# Patient Record
Sex: Female | Born: 1990 | Race: Black or African American | Hispanic: No | Marital: Single | Smoking: Current every day smoker
Health system: Southern US, Community
[De-identification: ages and names within clinical notes are randomized; demographics above are authoritative.]

## PROBLEM LIST (undated history)

## (undated) HISTORY — PX: NO PAST SURGERIES: SHX2092

---

## 2016-05-03 ENCOUNTER — Ambulatory Visit
Admission: EM | Admit: 2016-05-03 | Discharge: 2016-05-03 | Disposition: A | Discharge status: Home / Self Care | Payer: Medicaid Other | Attending: Family Medicine | Admitting: Family Medicine

## 2016-05-03 ENCOUNTER — Encounter: Payer: Self-pay | Admitting: *Deleted

## 2016-05-03 ENCOUNTER — Ambulatory Visit: Payer: Medicaid Other

## 2016-05-03 DIAGNOSIS — Z79899 Other long term (current) drug therapy: Secondary | ICD-10-CM | POA: Insufficient documentation

## 2016-05-03 DIAGNOSIS — M79644 Pain in right finger(s): Secondary | ICD-10-CM | POA: Diagnosis not present

## 2016-05-03 DIAGNOSIS — F17200 Nicotine dependence, unspecified, uncomplicated: Secondary | ICD-10-CM | POA: Insufficient documentation

## 2016-05-03 DIAGNOSIS — M12541 Traumatic arthropathy, right hand: Secondary | ICD-10-CM | POA: Diagnosis not present

## 2016-05-03 DIAGNOSIS — Z888 Allergy status to other drugs, medicaments and biological substances status: Secondary | ICD-10-CM | POA: Diagnosis not present

## 2016-05-03 MED ORDER — NAPROXEN 500 MG PO TABS: 500.0000 mg | ORAL_TABLET | Freq: Two times a day (BID) | ORAL | 0 refills | Status: DC

## 2016-05-03 NOTE — ED Provider Notes (Signed)
CSN: [id number]     Arrival date & time 05/03/16  1614 History   First MD Initiated Contact with Patient 05/03/16 1756     Chief Complaint  Patient presents with  . Finger Injury   (Consider location/radiation/quality/duration/timing/severity/associated sxs/prior Treatment) HPI  26 year old female who presents with nondominant right thumb pain that for about a month. Don't remember any injury that may have occurred. Indicates the IP joint is the most tender and swollen area. She has no pain of the M CP joint of the wrist or of the distal radius. 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 No pertinent past medical history. History reviewed. No pertinent surgical history. History reviewed. No pertinent family history. Social History  Substance Use Topics  . Smoking status: Current Every Day Smoker  . Smokeless tobacco: Never Used  . Alcohol use Yes   OB History    No data available     Review of Systems  Constitutional: Positive for activity change. Negative for chills, fatigue and fever.  Musculoskeletal: Positive for arthralgias and joint swelling.  All other systems reviewed and are negative.   Allergies  Norco [hydrocodone-acetaminophen]  Home Medications   Prior to Admission medications   Medication Sig Start Date End Date Taking? Authorizing Provider  norethindrone-ethinyl estradiol-iron (ESTROSTEP FE,TILIA FE,TRI-LEGEST FE) 1-20/1-30/1-35 MG-MCG tablet Take 1 tablet by mouth daily.   Yes Historical Provider, MD  naproxen (NAPROSYN) 500 MG tablet Take 1 tablet (500 mg total) by mouth 2 (two) times daily with a meal. 05/03/16   Lutricia FeilWilliam P Salisa Broz, PA-C   Meds Ordered and Administered this Visit  Medications - No data to display  BP 123/80 (BP Location: Right Arm)   Pulse 70   Temp 97.7 F (36.5 C) (Oral)   Resp 16   Ht 5\' 4"  (1.626 m)   Wt 160 lb (72.6 kg)    LMP 04/19/2016   SpO2 99%   BMI 27.46 kg/m  No data found.   Physical Exam  Constitutional: She is oriented to person, place, and time. She appears well-developed and well-nourished. No distress.  HENT:  Head: Normocephalic and atraumatic.  Eyes: Pupils are equal, round, and reactive to light.  Neck: Normal range of motion.  Musculoskeletal: Normal range of motion. She exhibits edema and tenderness.  Examination of the right nondominant hand swelling of the right thumb particularly of the IP joint. Induration is decreased due to tenderness. Is tender over the PIP joint to AP and bilateral compression.No Laxity of the collateral ligaments. Pull-through is good and strong.  Neurological: She is alert and oriented to person, place, and time.  Skin: Skin is warm and dry. She is not diaphoretic.  Psychiatric: She has a normal mood and affect. Her behavior is normal. Judgment and thought content normal.  Nursing note and vitals reviewed.   Urgent Care Course     Procedures (including critical care time)  Labs Review Labs Reviewed - No data to display  Imaging Review Dg Finger Thumb Right  Result Date: 05/03/2016 CLINICAL DATA:  26 y/o  F; first interphalangeal joint pain. EXAM: RIGHT THUMB 2+V COMPARISON:  None. FINDINGS: There is no evidence of fracture or dislocation. There is no evidence of arthropathy or other focal bone abnormality. Soft tissues are unremarkable IMPRESSION: Negative. Electronically Signed   By: Mitzi HansenLance  Furusawa-Stratton M.D.   On: 05/03/2016 18:36  Visual Acuity Review  Right Eye Distance:   Left Eye Distance:   Bilateral Distance:    Right Eye Near:   Left Eye Near:    Bilateral Near:     Patient was given a radial gutter wrist splint  MDM   1. Thumb pain, right   Traumatic arthritis IP joint New Prescriptions   NAPROXEN (NAPROSYN) 500 MG TABLET    Take 1 tablet (500 mg total) by mouth 2 (two) times daily with a meal.  Plan: 1. Test/x-ray  results and diagnosis reviewed with patient 2. rx as per orders; risks, benefits, potential side effects reviewed with patient 3. Recommend supportive treatment with Immobilization of the joint for at least a week and then gradually resume activities. She may come out of the splint for quiet times. Recommended that for all activities she wear the splint and forward activities that cause any discomfort. And anti-inflammatories for pain and inflammation. She needs to take these with food. If she is not improving she should follow-up with an orthopedic surgeon or her primary care.  4. F/u prn if symptoms worsen or don't improve     Lutricia Feil, PA-C 05/03/16 [address] [place], [place] 05/03/16 1907

## 2016-05-03 NOTE — ED Triage Notes (Signed)
Patient started having symptom of right thumb pain 1 month ago. Mechanism of injury unknown. Second joint of the thumb is inflamed.

## 2017-10-03 IMAGING — CR DG FINGER THUMB 2+V*R*
3 series · 3 of 3 positions shown · non-contrast
Comparison: None.

CLINICAL DATA: 25 y/o  F; first interphalangeal joint pain.

EXAM:
RIGHT THUMB 2+V

[finger ap]
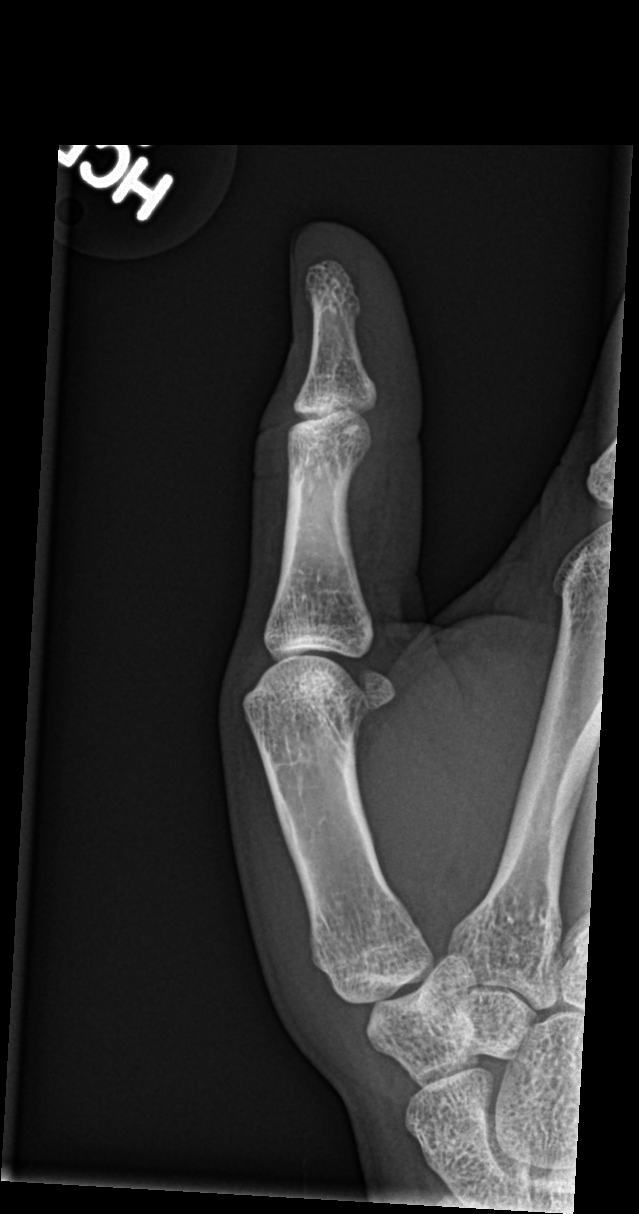

[finger obl]
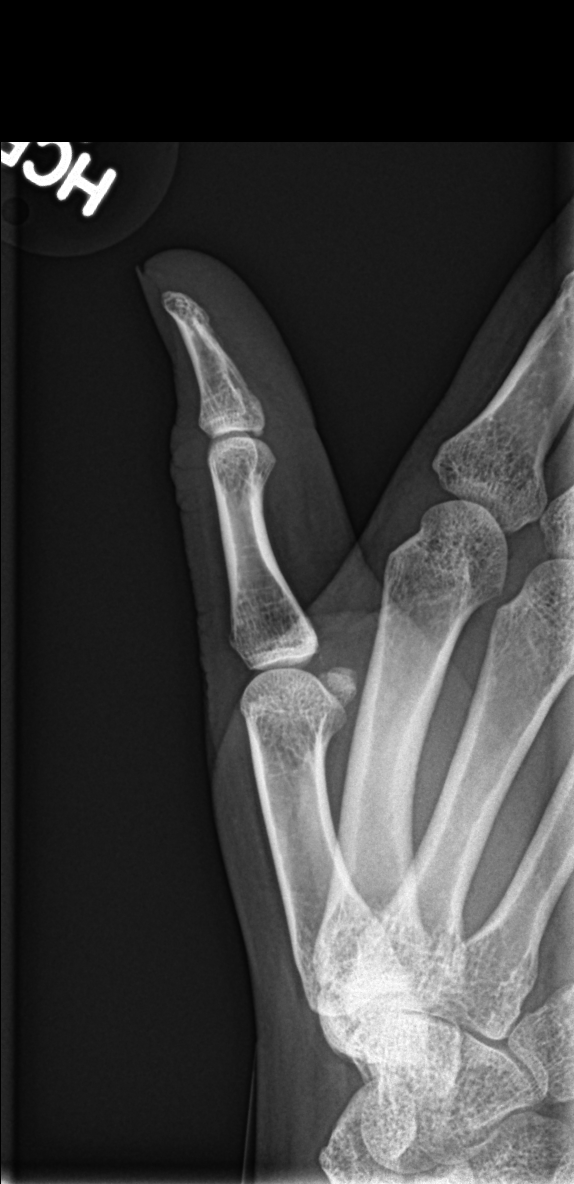

[finger lat]
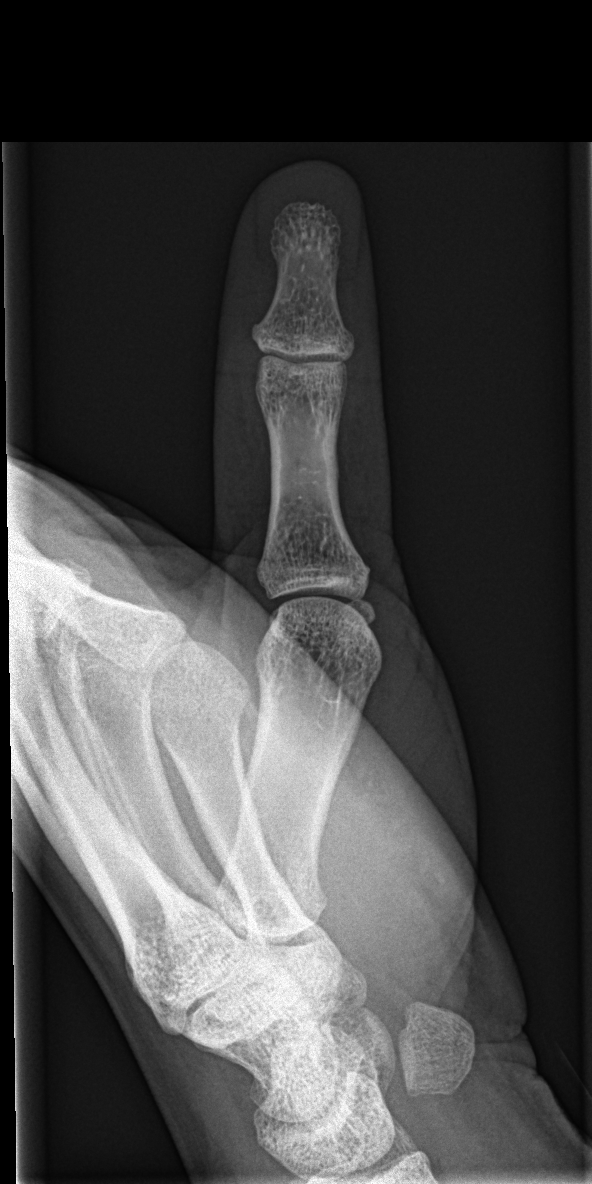

[3 of 3 positions shown; findings below may reference images not displayed]

FINDINGS: There is no evidence of fracture or dislocation. There is no
evidence of arthropathy or other focal bone abnormality. Soft
tissues are unremarkable
IMPRESSION: Negative.

By: Don Lolito Onacram M.D.

## 2017-10-24 ENCOUNTER — Ambulatory Visit
Admission: EM | Admit: 2017-10-24 | Discharge: 2017-10-24 | Disposition: A | Discharge status: Home / Self Care | Payer: Medicaid Other | Attending: Family Medicine | Admitting: Family Medicine

## 2017-10-24 ENCOUNTER — Encounter: Payer: Self-pay | Admitting: Emergency Medicine

## 2017-10-24 ENCOUNTER — Other Ambulatory Visit: Payer: Self-pay

## 2017-10-24 DIAGNOSIS — R300 Dysuria: Secondary | ICD-10-CM | POA: Diagnosis present

## 2017-10-24 DIAGNOSIS — N760 Acute vaginitis: Secondary | ICD-10-CM | POA: Diagnosis not present

## 2017-10-24 DIAGNOSIS — Z885 Allergy status to narcotic agent status: Secondary | ICD-10-CM | POA: Diagnosis not present

## 2017-10-24 DIAGNOSIS — Z79899 Other long term (current) drug therapy: Secondary | ICD-10-CM | POA: Insufficient documentation

## 2017-10-24 DIAGNOSIS — B9689 Other specified bacterial agents as the cause of diseases classified elsewhere: Secondary | ICD-10-CM | POA: Diagnosis not present

## 2017-10-24 LAB — URINALYSIS, COMPLETE (UACMP) WITH MICROSCOPIC
BILIRUBIN URINE: NEGATIVE
Glucose, UA: NEGATIVE mg/dL
KETONES UR: NEGATIVE mg/dL
LEUKOCYTES UA: NEGATIVE
Nitrite: NEGATIVE
PROTEIN: NEGATIVE mg/dL
Specific Gravity, Urine: 1.025 (ref 1.005–1.030)
pH: 6 (ref 5.0–8.0)

## 2017-10-24 LAB — WET PREP, GENITAL
Sperm: NONE SEEN
Trich, Wet Prep: NONE SEEN
Yeast Wet Prep HPF POC: NONE SEEN

## 2017-10-24 LAB — CHLAMYDIA/NGC RT PCR (ARMC ONLY)
Chlamydia Tr: NOT DETECTED
N gonorrhoeae: NOT DETECTED

## 2017-10-24 MED ORDER — METRONIDAZOLE 500 MG PO TABS: 500.0000 mg | ORAL_TABLET | Freq: Two times a day (BID) | ORAL | 0 refills | Status: DC

## 2017-10-24 NOTE — ED Triage Notes (Signed)
Pt c/o dysuria and urinary odor. Started 1 day ago. No fever, chills.

## 2017-10-24 NOTE — Discharge Instructions (Signed)
Do not drink alcohol while taking this medication. Do Not have unprotected sex during taking the medication.

## 2017-10-24 NOTE — ED Provider Notes (Signed)
[facility]    CSN: [id number] Arrival date & time: 10/24/17  1732     History   Chief Complaint Chief Complaint  Patient presents with  . Dysuria    HPI Ann Noble is a 27 y.o. female.   HPI  27 year old female presents with dysuria and a small amount of odor started about a day ago.  She denies any fever or chills.  She denies any nausea or vomiting.  States that yesterday she felt the onset but today is much worse.  She states that she has frequency as a usual occurrence as she has a "weak bladder".  Also had low back pain which is normal for her, she is a delivery dividual driving frequently.  She is states that she is currently on her menstrual cycle and has no idea if she has a vaginal discharge other than the menstrual flow.        History reviewed. No pertinent past medical history.  There are no active problems to display for this patient.   Past Surgical History:  Procedure Laterality Date  . NO PAST SURGERIES      OB History   None      Home Medications    Prior to Admission medications   Medication Sig Start Date End Date Taking? Authorizing Provider  norethindrone-ethinyl estradiol-iron (ESTROSTEP FE,TILIA FE,TRI-LEGEST FE) 1-20/1-30/1-35 MG-MCG tablet Take 1 tablet by mouth daily.   Yes [provider]  metroNIDAZOLE (FLAGYL) 500 MG tablet Take 1 tablet (500 mg total) by mouth 2 (two) times daily. 10/24/17   Lutricia Feil, PA-C    Family History Family History  Problem Relation Age of Onset  . Healthy Mother   . Healthy Father     Social History Social History   Tobacco Use  . Smoking status: Never Smoker  . Smokeless tobacco: Never Used  Substance Use Topics  . Alcohol use: Yes    Comment: once a month  . Drug use: No     Allergies   Norco [hydrocodone-acetaminophen]   Review of Systems Review of Systems  Constitutional: Positive for activity change. Negative for appetite change, chills, fatigue and  fever.  Genitourinary: Positive for dysuria, frequency and vaginal bleeding.  All other systems reviewed and are negative.    Physical Exam Triage Vital Signs ED Triage Vitals  Enc Vitals Group     BP 10/24/17 1750 110/67     Pulse Rate 10/24/17 1750 86     Resp 10/24/17 1750 16     Temp 10/24/17 1750 98.2 F (36.8 C)     Temp Source 10/24/17 1750 Oral     SpO2 10/24/17 1750 99 %     Weight 10/24/17 1749 171 lb (77.6 kg)     Height 10/24/17 1749 5\' 4"  (1.626 m)     Head Circumference --      Peak Flow --      Pain Score 10/24/17 1747 3     Pain Loc --      Pain Edu? --      Excl. in GC? --    No data found.  Updated Vital Signs BP 110/67 (BP Location: Left Arm)   Pulse 86   Temp 98.2 F (36.8 C) (Oral)   Resp 16   Ht 5\' 4"  (1.626 m)   Wt 171 lb (77.6 kg)   LMP 10/19/2017   SpO2 99%   BMI 29.35 kg/m   Visual Acuity Right Eye Distance:   Left Eye  Distance:   Bilateral Distance:    Right Eye Near:   Left Eye Near:    Bilateral Near:     Physical Exam  Constitutional: She is oriented to person, place, and time. She appears well-developed and well-nourished. No distress.  HENT:  Head: Normocephalic.  Eyes: Pupils are equal, round, and reactive to light. Right eye exhibits no discharge. Left eye exhibits no discharge.  Neck: Normal range of motion.  Pulmonary/Chest: Effort normal and breath sounds normal.  Abdominal: Soft. Bowel sounds are normal.   No CVA tenderness  Musculoskeletal: Normal range of motion.  Neurological: She is alert and oriented to person, place, and time.  Skin: Skin is warm and dry. She is not diaphoretic.  Psychiatric: She has a normal mood and affect. Her behavior is normal. Judgment and thought content normal.  Nursing note and vitals reviewed.    UC Treatments / Results  Labs (all labs ordered are listed, but only abnormal results are displayed) Labs Reviewed  WET PREP, GENITAL - Abnormal; Notable for the following components:       Result Value   Clue Cells Wet Prep HPF POC PRESENT (*)    WBC, Wet Prep HPF POC MODERATE (*)    All other components within normal limits  URINALYSIS, COMPLETE (UACMP) WITH MICROSCOPIC - Abnormal; Notable for the following components:   APPearance HAZY (*)    Hgb urine dipstick MODERATE (*)    Bacteria, UA FEW (*)    All other components within normal limits  CHLAMYDIA/NGC RT PCR [facility] ONLY)    EKG None  Radiology No results found.  Procedures Procedures (including critical care time)  Medications Ordered in UC Medications - No data to display  Initial Impression / Assessment and Plan / UC Course  I have reviewed the triage vital signs and the nursing notes.  Pertinent labs & imaging results that were available during my care of the patient were reviewed by me and considered in my medical decision making (see chart for details).     Plan: 1. Test/x-ray results and diagnosis reviewed with patient 2. rx as per orders; risks, benefits, potential side effects reviewed with patient 3. Recommend supportive treatment with Flagyl for 7 days 2 times daily.  Not drink while taking this medication and do not have unprotected sex during the course of the medication.  If you have any further problems follow-up with your primary care physician. 4. F/u prn if symptoms worsen or don't improve  Final Clinical Impressions(s) / UC Diagnoses   Final diagnoses:  BV (bacterial vaginosis)     Discharge Instructions     Do not drink alcohol while taking this medication. Do Not have unprotected sex during taking the medication.    ED Prescriptions    Medication Sig Dispense Auth. Provider   metroNIDAZOLE (FLAGYL) 500 MG tablet Take 1 tablet (500 mg total) by mouth 2 (two) times daily. 14 tablet Lutricia Feiloemer, Zuly Belkin P, PA-C     Controlled Substance Prescriptions  Controlled Substance Registry consulted? Not Applicable   Lutricia FeilRoemer, Gabrelle Roca P, PA-C 10/24/17 1859

## 2018-03-13 ENCOUNTER — Encounter: Payer: Self-pay | Admitting: Emergency Medicine

## 2018-03-13 ENCOUNTER — Other Ambulatory Visit: Payer: Self-pay

## 2018-03-13 ENCOUNTER — Ambulatory Visit
Admission: EM | Admit: 2018-03-13 | Discharge: 2018-03-13 | Disposition: A | Discharge status: Home / Self Care | Payer: Medicaid Other | Attending: Family Medicine | Admitting: Family Medicine

## 2018-03-13 DIAGNOSIS — B3731 Acute candidiasis of vulva and vagina: Secondary | ICD-10-CM

## 2018-03-13 DIAGNOSIS — N760 Acute vaginitis: Secondary | ICD-10-CM | POA: Insufficient documentation

## 2018-03-13 DIAGNOSIS — B373 Candidiasis of vulva and vagina: Secondary | ICD-10-CM | POA: Diagnosis present

## 2018-03-13 DIAGNOSIS — B9689 Other specified bacterial agents as the cause of diseases classified elsewhere: Secondary | ICD-10-CM | POA: Insufficient documentation

## 2018-03-13 LAB — CHLAMYDIA/NGC RT PCR (ARMC ONLY)
CHLAMYDIA TR: NOT DETECTED
N GONORRHOEAE: NOT DETECTED

## 2018-03-13 LAB — URINALYSIS, COMPLETE (UACMP) WITH MICROSCOPIC
Bilirubin Urine: NEGATIVE
Glucose, UA: NEGATIVE mg/dL
Hgb urine dipstick: NEGATIVE
Ketones, ur: NEGATIVE mg/dL
Nitrite: NEGATIVE
Protein, ur: NEGATIVE mg/dL
SPECIFIC GRAVITY, URINE: 1.025 (ref 1.005–1.030)
pH: 8.5 — ABNORMAL HIGH (ref 5.0–8.0)

## 2018-03-13 LAB — WET PREP, GENITAL
Sperm: NONE SEEN
TRICH WET PREP: NONE SEEN

## 2018-03-13 MED ORDER — METRONIDAZOLE 500 MG PO TABS: 500.0000 mg | ORAL_TABLET | Freq: Two times a day (BID) | ORAL | 0 refills | Status: DC

## 2018-03-13 NOTE — Discharge Instructions (Signed)
Take the medication that you have at home for yeast.  Take the antibiotic as prescribed.  Take care  Dr. Adriana Simas

## 2018-03-13 NOTE — ED Provider Notes (Signed)
[facility]    CSN: [id number] Arrival date & time: 03/13/18  1124  History   Chief Complaint Chief Complaint  Patient presents with  . Dysuria  . Vaginal Discharge   HPI  28 year old female presents with the above complaints.  2-day history of mild dysuria and vaginal discharge.  Dysuria has now resolved.  No other urinary symptoms.  No medications or interventions tried.  No known exacerbating relieving factors.  No reports of abdominal pain.  No other associated symptoms.  No other complaints.  PMH, Surgical Hx, Family Hx, Social History reviewed and updated as below.  PMH: Hx of abnormal pap smear.  Surgical Hx: D & C  OB History   No obstetric history on file.    Home Medications    Prior to Admission medications   Medication Sig Start Date End Date Taking? Authorizing Provider  norethindrone-ethinyl estradiol-iron (ESTROSTEP FE,TILIA FE,TRI-LEGEST FE) 1-20/1-30/1-35 MG-MCG tablet Take 1 tablet by mouth daily.   Yes [provider]  metroNIDAZOLE (FLAGYL) 500 MG tablet Take 1 tablet (500 mg total) by mouth 2 (two) times daily. 03/13/18   Tommie Samsook, Tiandre Teall G, DO   Family History Family History  Problem Relation Age of Onset  . Healthy Mother   . Healthy Father     Social History Social History   Tobacco Use  . Smoking status: Current Every Day Smoker    Types: Cigars  . Smokeless tobacco: Never Used  Substance Use Topics  . Alcohol use: Yes    Comment: once a month  . Drug use: No   Allergies   Norco [hydrocodone-acetaminophen]  Review of Systems Review of Systems  Gastrointestinal: Negative.   Genitourinary: Positive for dysuria and vaginal discharge.   Physical Exam Triage Vital Signs ED Triage Vitals  Enc Vitals Group     BP 03/13/18 1142 117/77     Pulse Rate 03/13/18 1142 66     Resp 03/13/18 1142 16     Temp 03/13/18 1142 98.2 F (36.8 C)     Temp Source 03/13/18 1142 Oral     SpO2 03/13/18 1142 100 %     Weight  03/13/18 1140 179 lb (81.2 kg)     Height 03/13/18 1140 5\' 4"  (1.626 m)     Head Circumference --      Peak Flow --      Pain Score 03/13/18 1139 0     Pain Loc --      Pain Edu? --    Updated Vital Signs BP 117/77 (BP Location: Left Arm)   Pulse 66   Temp 98.2 F (36.8 C) (Oral)   Resp 16   Ht 5\' 4"  (1.626 m)   Wt 81.2 kg   LMP 02/27/2018 (Approximate)   SpO2 100%   BMI 30.73 kg/m   Visual Acuity Right Eye Distance:   Left Eye Distance:   Bilateral Distance:    Right Eye Near:   Left Eye Near:    Bilateral Near:     Physical Exam Vitals signs and nursing note reviewed.  Constitutional:      General: She is not in acute distress. HENT:     Head: Normocephalic and atraumatic.  Eyes:     Conjunctiva/sclera: Conjunctivae normal.  Cardiovascular:     Rate and Rhythm: Normal rate and regular rhythm.  Pulmonary:     Effort: Pulmonary effort is normal.     Breath sounds: No wheezing, rhonchi or rales.  Abdominal:     General:  There is no distension.     Palpations: Abdomen is soft.     Tenderness: There is no abdominal tenderness.  Neurological:     Mental Status: She is alert.  Psychiatric:        Mood and Affect: Mood normal.        Behavior: Behavior normal.    UC Treatments / Results  Labs (all labs ordered are listed, but only abnormal results are displayed) Labs Reviewed  WET PREP, GENITAL - Abnormal; Notable for the following components:      Result Value   Yeast Wet Prep HPF POC PRESENT (*)    Clue Cells Wet Prep HPF POC PRESENT (*)    WBC, Wet Prep HPF POC MANY (*)    All other components within normal limits  URINALYSIS, COMPLETE (UACMP) WITH MICROSCOPIC - Abnormal; Notable for the following components:   pH 8.5 (*)    Leukocytes, UA SMALL (*)    Bacteria, UA RARE (*)    All other components within normal limits  CHLAMYDIA/NGC RT PCR ([facility] ONLY)    EKG None  Radiology No results found.  Procedures Procedures (including  critical care time)  Medications Ordered in UC Medications - No data to display  Initial Impression / Assessment and Plan / UC Course  I have reviewed the triage vital signs and the nursing notes.  Pertinent labs & imaging results that were available during my care of the patient were reviewed by me and considered in my medical decision making (see chart for details).    28 year old female presents with yeast vaginitis as well as bacterial vaginosis.  Patient states that she has Diflucan at home that was prescribed previously that she did not take.  Advised to take as prescribed.  Flagyl as directed.  Final Clinical Impressions(s) / UC Diagnoses   Final diagnoses:  Yeast vaginitis  BV (bacterial vaginosis)     Discharge Instructions     Take the medication that you have at home for yeast.  Take the antibiotic as prescribed.  Take care  Dr. Adriana Simas    ED Prescriptions    Medication Sig Dispense Auth. Provider   metroNIDAZOLE (FLAGYL) 500 MG tablet Take 1 tablet (500 mg total) by mouth 2 (two) times daily. 14 tablet Tommie Sams, DO     Controlled Substance Prescriptions  Controlled Substance Registry consulted? Not Applicable   Tommie Sams, DO 03/13/18 1314

## 2018-03-13 NOTE — ED Triage Notes (Signed)
Patient c/o some burning when urinating for the past 2 days.  Patient also reports vaginal discharged for a week.

## 2018-03-16 ENCOUNTER — Telehealth: Payer: Self-pay | Admitting: Emergency Medicine

## 2018-03-16 NOTE — Telephone Encounter (Signed)
Patient called regarding recent lab results.  Negative results communicated, patient verbalized understanding.

## 2020-02-29 ENCOUNTER — Ambulatory Visit
Admission: EM | Admit: 2020-02-29 | Discharge: 2020-02-29 | Disposition: A | Discharge status: Home / Self Care | Payer: Medicaid Other | Attending: Physician Assistant | Admitting: Physician Assistant

## 2020-02-29 ENCOUNTER — Other Ambulatory Visit: Payer: Self-pay

## 2020-02-29 DIAGNOSIS — Z3201 Encounter for pregnancy test, result positive: Secondary | ICD-10-CM | POA: Insufficient documentation

## 2020-02-29 DIAGNOSIS — R110 Nausea: Secondary | ICD-10-CM

## 2020-02-29 LAB — URINALYSIS, COMPLETE (UACMP) WITH MICROSCOPIC
Glucose, UA: NEGATIVE mg/dL
Hgb urine dipstick: NEGATIVE
Leukocytes,Ua: NEGATIVE
Nitrite: NEGATIVE
Protein, ur: NEGATIVE mg/dL
Specific Gravity, Urine: >1.03 — ABNORMAL HIGH (ref 1.005–1.030)
WBC, UA: NONE SEEN WBC/hpf (ref 0–5)
pH: 5.5 (ref 5.0–8.0)

## 2020-02-29 LAB — PREGNANCY, URINE: Preg Test, Ur: POSITIVE — AB

## 2020-02-29 MED ORDER — VITAMIN B-6 25 MG PO TABS: 25.0000 mg | ORAL_TABLET | Freq: Three times a day (TID) | ORAL | 0 refills | Status: AC | PRN

## 2020-02-29 NOTE — ED Triage Notes (Signed)
Pt c/o persistent nausea for several weeks. Concerned for poss preg. LMP 12/30/19 no birth control in use. Denies abdominal pain, vomiting, URI symptoms.

## 2020-02-29 NOTE — ED Provider Notes (Signed)
[facility]    CSN: [id number] Arrival date & time: 02/29/20  1723      History   Chief Complaint Chief Complaint  Patient presents with   Possible Pregnancy    HPI Ann Noble is a 29 y.o. female presenting for nausea for several weeks.  Patient states that she believes she may be pregnant since she had a positive pregnancy test 2 weeks ago at home.  Patient states her last menstrual period started on 12/30/2019 lasted for a few days.  Patient has not been using any birth control.  She has been taking over-the-counter Emetrol for nausea with slight improvement in symptoms.  Denies any vomiting.  Denies any abdominal pain or irregular bleeding.  Patient states symptoms feel similar to when she was pregnant with her first child.  Patient states she tried to make an appointment with her PCP, but they are booked up.  Patient does not have an OB/gyne.  She wants confirmation of pregnancy today.  She has no other complaints or concerns.  HPI  History reviewed. No pertinent past medical history.  There are no problems to display for this patient.   Past Surgical History:  Procedure Laterality Date   NO PAST SURGERIES      OB History   No obstetric history on file.      Home Medications    Prior to Admission medications   Medication Sig Start Date End Date Taking? Authorizing Provider  vitamin B-6 (PYRIDOXINE) 25 MG tablet Take 1 tablet (25 mg total) by mouth 3 (three) times daily as needed. 02/29/20 03/30/20 Yes Shirlee Latch, PA-C  norethindrone-ethinyl estradiol-iron (ESTROSTEP FE,TILIA FE,TRI-LEGEST FE) 1-20/1-30/1-35 MG-MCG tablet Take 1 tablet by mouth daily.  02/29/20  [provider]    Family History Family History  Problem Relation Age of Onset   Healthy Mother    Healthy Father     Social History Social History   Tobacco Use   Smoking status: Current Every Day Smoker    Types: Cigars   Smokeless tobacco: Never Used  Water quality scientist Use: Never used  Substance Use Topics   Alcohol use: Yes   Drug use: No     Allergies   Hydrocodone-acetaminophen, Norco [hydrocodone-acetaminophen], and Hydrocodone   Review of Systems Review of Systems  Constitutional: Negative for fatigue and fever.  Respiratory: Negative for shortness of breath.   Cardiovascular: Negative for chest pain.  Gastrointestinal: Positive for nausea. Negative for abdominal pain and vomiting.  Genitourinary: Negative for difficulty urinating, dysuria, hematuria, vaginal discharge and vaginal pain.  Musculoskeletal: Negative for myalgias.  Neurological: Negative for dizziness, weakness and headaches.     Physical Exam Triage Vital Signs ED Triage Vitals  Enc Vitals Group     BP 02/29/20 2031 111/64     Pulse Rate 02/29/20 2031 90     Resp 02/29/20 2031 18     Temp 02/29/20 2031 98.5 F (36.9 C)     Temp Source 02/29/20 2031 Oral     SpO2 02/29/20 2031 99 %     Weight --      Height --      Head Circumference --      Peak Flow --      Pain Score 02/29/20 2030 0     Pain Loc --      Pain Edu? --      Excl. in GC? --    No data found.  Updated Vital Signs BP  111/64 (BP Location: Left Arm)    Pulse 90    Temp 98.5 F (36.9 C) (Oral)    Resp 18    LMP 12/30/2019    SpO2 99%    Physical Exam Vitals and nursing note reviewed.  Constitutional:      General: She is not in acute distress.    Appearance: Normal appearance. She is not ill-appearing or toxic-appearing.  HENT:     Head: Normocephalic and atraumatic.  Eyes:     General: No scleral icterus.       Right eye: No discharge.        Left eye: No discharge.     Conjunctiva/sclera: Conjunctivae normal.  Cardiovascular:     Rate and Rhythm: Normal rate and regular rhythm.     Heart sounds: Normal heart sounds.  Pulmonary:     Effort: Pulmonary effort is normal. No respiratory distress.     Breath sounds: Normal breath sounds.  Musculoskeletal:     Cervical  back: Neck supple.  Skin:    General: Skin is dry.  Neurological:     General: No focal deficit present.     Mental Status: She is alert. Mental status is at baseline.     Motor: No weakness.     Gait: Gait normal.  Psychiatric:        Mood and Affect: Mood normal.        Behavior: Behavior normal.        Thought Content: Thought content normal.      UC Treatments / Results  Labs (all labs ordered are listed, but only abnormal results are displayed) Labs Reviewed  URINALYSIS, COMPLETE (UACMP) WITH MICROSCOPIC - Abnormal; Notable for the following components:      Result Value   Specific Gravity, Urine >1.030 (*)    Bilirubin Urine SMALL (*)    Ketones, ur TRACE (*)    Bacteria, UA FEW (*)    All other components within normal limits  PREGNANCY, URINE - Abnormal; Notable for the following components:   Preg Test, Ur POSITIVE (*)    All other components within normal limits    EKG   Radiology No results found.  Procedures Procedures (including critical care time)  Medications Ordered in UC Medications - No data to display  Initial Impression / Assessment and Plan / UC Course  I have reviewed the triage vital signs and the nursing notes.  Pertinent labs & imaging results that were available during my care of the patient were reviewed by me and considered in my medical decision making (see chart for details).   Positive urine pregnancy today.  Discussed results with patient.  Advised to follow-up with PCP as they will need to refer her to OB/gyne since she has Medicaid.  Discussed treatment for morning sickness with patient.  Sent vitamin B6 and encouraged increasing fluids and eating smaller meals.  ED precautions reviewed with patient.   Final Clinical Impressions(s) / UC Diagnoses   Final diagnoses:  Positive pregnancy test  Nausea without vomiting     Discharge Instructions     Pregnancy test was positive.  Begin prenatal vitamins at this time.  I have  sent medication to help with your nausea.  Follow-up with your PCP as they will need to refer you to OB/gyne for your follow-ups regarding this pregnancy.  Eat smaller meals to help with the nausea and make sure you stay hydrated.  Follow-up with [place] as needed.    ED Prescriptions  Medication Sig Dispense Auth. Provider   vitamin B-6 (PYRIDOXINE) 25 MG tablet Take 1 tablet (25 mg total) by mouth 3 (three) times daily as needed. 60 tablet Gareth Morgan     PDMP not reviewed this encounter.   Shirlee Latch, PA-C 02/29/20 2103

## 2020-02-29 NOTE — Discharge Instructions (Signed)
Pregnancy test was positive.  Begin prenatal vitamins at this time.  I have sent medication to help with your nausea.  Follow-up with your PCP as they will need to refer you to OB/gyne for your follow-ups regarding this pregnancy.  Eat smaller meals to help with the nausea and make sure you stay hydrated.  Follow-up with [place] as needed.

## 2021-05-19 ENCOUNTER — Ambulatory Visit: Payer: Self-pay

## 2023-05-23 ENCOUNTER — Encounter: Payer: Self-pay | Admitting: Emergency Medicine

## 2023-05-23 ENCOUNTER — Ambulatory Visit
Admission: EM | Admit: 2023-05-23 | Discharge: 2023-05-23 | Disposition: A | Discharge status: Home / Self Care | Attending: Emergency Medicine | Admitting: Emergency Medicine

## 2023-05-23 DIAGNOSIS — N898 Other specified noninflammatory disorders of vagina: Secondary | ICD-10-CM | POA: Diagnosis present

## 2023-05-23 NOTE — ED Triage Notes (Signed)
 Patient c/o vaginal discharge and itching that started 4 days ago.

## 2023-05-23 NOTE — ED Provider Notes (Signed)
 [facility]    CSN: [id number] Arrival date & time: 05/23/23  1541      History   Chief Complaint Chief Complaint  Patient presents with   Vaginal Discharge    HPI Ann Noble is a 33 y.o. female.   HPI  33 year old female with no significant past medical history, and who is currently 5 weeks 4 days gestation presents for evaluation of vaginal itching and white vaginal discharge with a fishy odor that started 4 days ago.  Also low back pain.  She denies any urinary symptoms or hematuria.  History reviewed. No pertinent past medical history.  There are no active problems to display for this patient.   Past Surgical History:  Procedure Laterality Date   NO PAST SURGERIES      OB History     Gravida  1   Para      Term      Preterm      AB      Living         SAB      IAB      Ectopic      Multiple      Live Births               Home Medications    Prior to Admission medications   Medication Sig Start Date End Date Taking? Authorizing Provider  norethindrone-ethinyl estradiol-iron (ESTROSTEP FE,TILIA FE,TRI-LEGEST FE) 1-20/1-30/1-35 MG-MCG tablet Take 1 tablet by mouth daily.  02/29/20  [provider]    Family History Family History  Problem Relation Age of Onset   Healthy Mother    Healthy Father     Social History Social History   Tobacco Use   Smoking status: Every Day    Types: Cigars   Smokeless tobacco: Never  Vaping Use   Vaping status: Never Used  Substance Use Topics   Alcohol use: Yes   Drug use: No     Allergies   Hydrocodone-acetaminophen, Norco [hydrocodone-acetaminophen], and Hydrocodone   Review of Systems Review of Systems  Constitutional:  Negative for fever.  Genitourinary:  Positive for vaginal discharge and vaginal pain. Negative for dysuria, frequency, hematuria and urgency.  Musculoskeletal:  Positive for back pain.     Physical Exam Triage Vital Signs ED Triage Vitals  [05/23/23 1551]  Encounter Vitals Group     BP      Systolic BP Percentile      Diastolic BP Percentile      Pulse      Resp      Temp      Temp src      SpO2      Weight 204 lb (92.5 kg)     Height 5\' 4"  (1.626 m)     Head Circumference      Peak Flow      Pain Score 0     Pain Loc      Pain Education      Exclude from Growth Chart    No data found.  Updated Vital Signs BP 119/77 (BP Location: Right Arm)   Pulse 80   Temp 98.5 F (36.9 C) (Oral)   Resp 14   Ht 5\' 4"  (1.626 m)   Wt 204 lb (92.5 kg)   SpO2 97%   BMI 35.02 kg/m   Visual Acuity Right Eye Distance:   Left Eye Distance:   Bilateral Distance:    Right Eye Near:   Left Eye  Near:    Bilateral Near:     Physical Exam Vitals and nursing note reviewed.  Constitutional:      Appearance: Normal appearance. She is not ill-appearing.  HENT:     Head: Normocephalic and atraumatic.  Cardiovascular:     Rate and Rhythm: Normal rate and regular rhythm.     Pulses: Normal pulses.     Heart sounds: Normal heart sounds. No murmur heard.    No friction rub. No gallop.  Pulmonary:     Effort: Pulmonary effort is normal.     Breath sounds: Normal breath sounds. No wheezing, rhonchi or rales.  Skin:    General: Skin is warm and dry.     Capillary Refill: Capillary refill takes less than 2 seconds.     Findings: No rash.  Neurological:     General: No focal deficit present.     Mental Status: She is alert and oriented to person, place, and time.      UC Treatments / Results  Labs (all labs ordered are listed, but only abnormal results are displayed) Labs Reviewed  CERVICOVAGINAL ANCILLARY ONLY    EKG   Radiology No results found.  Procedures Procedures (including critical care time)  Medications Ordered in UC Medications - No data to display  Initial Impression / Assessment and Plan / UC Course  I have reviewed the triage vital signs and the nursing notes.  Pertinent labs & imaging  results that were available during my care of the patient were reviewed by me and considered in my medical decision making (see chart for details).   Patient is a nontoxic-appearing 33 year old female presenting for evaluation of vaginal discharge and itching as outlined HPI above.  She denies any concern for STIs.  She is currently 5 weeks 4 days gestation and is complaining of a white vaginal discharge with an unpleasant odor.  When asked further she states it may be fishlike.  She denies any UTI symptoms.  I will order a vaginal cytology swab to evaluate the presence of possible BV or yeast.  However, due to the fact that the patient is currently pregnant I will hold off on empiric treatment until after at the test results.  I have advised her that these will be back tomorrow.   Final Clinical Impressions(s) / UC Diagnoses   Final diagnoses:  Vaginal discharge  Vaginal itching     Discharge Instructions      Your vaginal swab will be back in the next 24 hours.  We will treat you based off of the test results.  The swab will look for the presence of bacterial vaginosis or yeast.     ED Prescriptions   None    PDMP not reviewed this encounter.   Becky Augusta, NP 05/23/23 1600

## 2023-05-23 NOTE — Discharge Instructions (Addendum)
 Your vaginal swab will be back in the next 24 hours.  We will treat you based off of the test results.  The swab will look for the presence of bacterial vaginosis or yeast.

## 2023-05-26 ENCOUNTER — Telehealth (HOSPITAL_COMMUNITY): Payer: Self-pay

## 2023-05-26 LAB — CERVICOVAGINAL ANCILLARY ONLY
Bacterial Vaginitis (gardnerella): POSITIVE — AB
Candida Glabrata: NEGATIVE
Candida Vaginitis: NEGATIVE
Chlamydia: NEGATIVE
Comment: NEGATIVE
Comment: NEGATIVE
Comment: NEGATIVE
Comment: NEGATIVE
Comment: NEGATIVE
Comment: NORMAL
Neisseria Gonorrhea: NEGATIVE
Trichomonas: NEGATIVE

## 2023-05-26 MED ORDER — METRONIDAZOLE 0.75 % VA GEL: 1.0000 | Freq: Every day | VAGINAL | 0 refills | Status: AC

## 2023-05-26 NOTE — Telephone Encounter (Signed)
 Per protocol, pt requires tx with metrogel. Rx sent to pharmacy on file.

## 2023-07-25 LAB — PANORAMA PRENATAL TEST FULL PANEL:PANORAMA TEST PLUS 5 ADDITIONAL MICRODELETIONS: FETAL FRACTION: 7
